# Patient Record
Sex: Male | Born: 1965 | Race: Black or African American | Hispanic: No | Marital: Single | Smoking: Never smoker
Health system: Southern US, Community
[De-identification: ages and names within clinical notes are randomized; demographics above are authoritative.]

---

## 2016-06-12 ENCOUNTER — Emergency Department (HOSPITAL_COMMUNITY): Payer: BLUE CROSS/BLUE SHIELD

## 2016-06-12 ENCOUNTER — Emergency Department (HOSPITAL_COMMUNITY)
Admission: EM | Admit: 2016-06-12 | Discharge: 2016-06-12 | Disposition: A | Discharge status: Home / Self Care | Payer: BLUE CROSS/BLUE SHIELD | Attending: Emergency Medicine | Admitting: Emergency Medicine

## 2016-06-12 ENCOUNTER — Encounter (HOSPITAL_COMMUNITY): Payer: Self-pay | Admitting: *Deleted

## 2016-06-12 DIAGNOSIS — R519 Headache, unspecified: Secondary | ICD-10-CM

## 2016-06-12 DIAGNOSIS — R51 Headache: Secondary | ICD-10-CM | POA: Insufficient documentation

## 2016-06-12 LAB — I-STAT CHEM 8, ED
BUN: 21 mg/dL — AB (ref 6–20)
CALCIUM ION: 1.12 mmol/L — AB (ref 1.15–1.40)
CHLORIDE: 99 mmol/L — AB (ref 101–111)
Creatinine, Ser: 1.4 mg/dL — ABNORMAL HIGH (ref 0.61–1.24)
GLUCOSE: 173 mg/dL — AB (ref 65–99)
HCT: 49 % (ref 39.0–52.0)
Hemoglobin: 16.7 g/dL (ref 13.0–17.0)
Potassium: 4.8 mmol/L (ref 3.5–5.1)
Sodium: 138 mmol/L (ref 135–145)
TCO2: 27 mmol/L (ref 0–100)

## 2016-06-12 MED ORDER — DIPHENHYDRAMINE HCL 50 MG/ML IJ SOLN
25.0000 mg | Freq: Once | INTRAMUSCULAR | Status: AC
  Administered 2016-06-12: 25 mg via INTRAVENOUS
  Filled 2016-06-12: qty 1

## 2016-06-12 MED ORDER — SODIUM CHLORIDE 0.9 % IV BOLUS (SEPSIS)
500.0000 mL | Freq: Once | INTRAVENOUS | Status: AC
  Administered 2016-06-12: 500 mL via INTRAVENOUS

## 2016-06-12 MED ORDER — METOCLOPRAMIDE HCL 5 MG/ML IJ SOLN
10.0000 mg | Freq: Once | INTRAMUSCULAR | Status: AC
Start: 2016-06-12 — End: 2016-06-12
  Administered 2016-06-12: 10 mg via INTRAVENOUS
  Filled 2016-06-12: qty 2

## 2016-06-12 MED ORDER — KETOROLAC TROMETHAMINE 30 MG/ML IJ SOLN
30.0000 mg | Freq: Once | INTRAMUSCULAR | Status: AC
  Administered 2016-06-12: 30 mg via INTRAVENOUS
  Filled 2016-06-12: qty 1

## 2016-06-12 NOTE — ED Notes (Signed)
Patient able to ambulate independently  

## 2016-06-12 NOTE — ED Triage Notes (Addendum)
Pt sent here from an ucc for headache since yesterday that he describes as "worst HA of my life" onset yesterday morning with n/v and sensitivity to light. Denies fever or neck pain. Denies hx of migraines. Minimal relief with ibuprofen.

## 2016-06-12 NOTE — ED Notes (Signed)
Pt transported to CT ?

## 2016-06-12 NOTE — ED Provider Notes (Signed)
Emergency Department Provider Note   I have reviewed the triage vital signs and the nursing notes.   HISTORY  Chief Complaint Headache   HPI Mason Garcia is a 50 y.o. male with no significant PMH presents to the emergent department for evaluation of acute onset headache. Patient's headache began suddenly while he was drinking coffee yesterday morning. He described a headache that reached maximal intensity over a course approximately 10 minutes was located primarily over his right head. No associated neck stiffness or fever. No history of similar headaches in the past. No head trauma. Patient states that he associated with drinking coffee and so vomited that up and felt slightly better. The headache never completely went away and he was able to sleep overnight. This morning, the patient again drink coffee and his headache suddenly worsened.   He denies any weakness or numbness. No gait instability. No continued nausea and or vomiting.   History reviewed. No pertinent past medical history.  There are no active problems to display for this patient.   History reviewed. No pertinent surgical history.    Allergies Tomasa BlaseBacon flavor  History reviewed. No pertinent family history.  Social History Social History  Substance Use Topics  . Smoking status: Never Smoker  . Smokeless tobacco: Not on file  . Alcohol use Yes    Review of Systems  Constitutional: No fever/chills Eyes: No visual changes. ENT: No sore throat. Cardiovascular: Denies chest pain. Respiratory: Denies shortness of breath. Gastrointestinal: No abdominal pain.  No nausea, no vomiting.  No diarrhea.  No constipation. Genitourinary: Negative for dysuria. Musculoskeletal: Negative for back pain. Skin: Negative for rash. Neurological: Negative for focal weakness or numbness. Positive HA.   10-point ROS otherwise negative.  ____________________________________________   PHYSICAL EXAM:  VITAL  SIGNS: ED Triage Vitals  Enc Vitals Group     BP 06/12/16 1748 (!) 163/102     Pulse Rate 06/12/16 1748 66     Resp 06/12/16 1748 18     Temp 06/12/16 1748 98.3 F (36.8 C)     Temp Source 06/12/16 1748 Oral     SpO2 06/12/16 1748 100 %     Pain Score 06/12/16 1754 7   Constitutional: Alert and oriented. Well appearing and in no acute distress. Eyes: Conjunctivae are normal.  Head: Atraumatic. Nose: No congestion/rhinnorhea. Mouth/Throat: Mucous membranes are moist.  Oropharynx non-erythematous. Neck: No stridor.  No meningeal signs. Cardiovascular: Normal rate, regular rhythm. Good peripheral circulation. Grossly normal heart sounds.   Respiratory: Normal respiratory effort.  No retractions. Lungs CTAB. Gastrointestinal: Soft and nontender. No distention.  Musculoskeletal: No lower extremity tenderness nor edema. No gross deformities of extremities. Neurologic:  Normal speech and language. No gross focal neurologic deficits are appreciated. Normal gait.  Skin:  Skin is warm, dry and intact. No rash noted.  ____________________________________________   LABS (all labs ordered are listed, but only abnormal results are displayed)  Labs Reviewed  I-STAT CHEM 8, ED - Abnormal; Notable for the following:       Result Value   Chloride 99 (*)    BUN 21 (*)    Creatinine, Ser 1.40 (*)    Glucose, Bld 173 (*)    Calcium, Ion 1.12 (*)    All other components within normal limits   ____________________________________________  RADIOLOGY  Ct Head Wo Contrast  Result Date: 06/12/2016 CLINICAL DATA:  Acute onset of generalized headache, nausea and vomiting. Initial encounter. EXAM: CT HEAD WITHOUT CONTRAST TECHNIQUE: Contiguous axial images were  obtained from the base of the skull through the vertex without intravenous contrast. COMPARISON:  None. FINDINGS: Brain: No evidence of acute infarction, hemorrhage, hydrocephalus, extra-axial collection or mass lesion/mass effect. The  posterior fossa, including the cerebellum, brainstem and fourth ventricle, is within normal limits. The third and lateral ventricles, and basal ganglia are unremarkable in appearance. The cerebral hemispheres are symmetric in appearance, with normal gray-white differentiation. No mass effect or midline shift is seen. Vascular: No hyperdense vessel or unexpected calcification. Skull: There is no evidence of fracture; visualized osseous structures are unremarkable in appearance. Sinuses/Orbits: The orbits are within normal limits. The paranasal sinuses and mastoid air cells are well-aerated. Other: No significant soft tissue abnormalities are seen. IMPRESSION: Unremarkable noncontrast CT of the head. Electronically Signed   By: Roanna RaiderJeffery  Chang M.D.   On: 06/12/2016 20:39    ____________________________________________   PROCEDURES  Procedure(s) performed:   Procedures  None ____________________________________________   INITIAL IMPRESSION / ASSESSMENT AND PLAN / ED COURSE  Pertinent labs & imaging results that were available during my care of the patient were reviewed by me and considered in my medical decision making (see chart for details).  Patient presents to the emergency department for evaluation of sudden onset headache both this morning and yesterday morning seen to be associated with coffee. This is somewhat atypical story but the patient is describing sudden onset maximal intensity headache. Plan for CT scan and baseline labs. If negative will discuss with patient regarding the risks and benefits of lumbar puncture.   09:13 PM Patient is feeling much better after Reglan and Benadryl. CT scan of the head is negative for bleed. I discussed that given the nature of his headache aneurysmal sentinel bleed is still my differential. I discussed that missing this sort of diagnosis could lead to catastrophic morbidity or even mortality. I discussed that lumbar puncture is the next step to rule  out this diagnosis and explained the procedure to the patient. After discussion he decided he would NOT like to pursue the lumbar puncture. He has capacity to understand this and make this decision. Discussed PCP/Neurology follow up and return precautions if HA returns. Will add Toradol with HA almost completely resolved.  ____________________________________________  FINAL CLINICAL IMPRESSION(S) / ED DIAGNOSES  Final diagnoses:  Acute nonintractable headache, unspecified headache type     MEDICATIONS GIVEN DURING THIS VISIT:  Medications  sodium chloride 0.9 % bolus 500 mL (0 mLs Intravenous Stopped 06/12/16 2206)  metoCLOPramide (REGLAN) injection 10 mg (10 mg Intravenous Given 06/12/16 2127)  diphenhydrAMINE (BENADRYL) injection 25 mg (25 mg Intravenous Given 06/12/16 2125)  ketorolac (TORADOL) 30 MG/ML injection 30 mg (30 mg Intravenous Given 06/12/16 2125)     NEW OUTPATIENT MEDICATIONS STARTED DURING THIS VISIT:  None   Note:  This document was prepared using Dragon voice recognition software and may include unintentional dictation errors.  Alona BeneJoshua Long, MD Emergency Medicine   Maia PlanJoshua G Long, MD 06/12/16 [phone]

## 2016-06-12 NOTE — Discharge Instructions (Signed)

## 2017-10-24 IMAGING — CT CT HEAD W/O CM
4 series · 16 of 47 positions shown, 18 images · non-contrast
Comparison: None.

CLINICAL DATA: Acute onset of generalized headache, nausea and
vomiting. Initial encounter.

EXAM:
CT HEAD WITHOUT CONTRAST
TECHNIQUE: Contiguous axial images were obtained from the base of the skull
through the vertex without intravenous contrast.

[Series 2: head without · axial · non-contrast · 0.45mm/px · z∈[-4,+126]mm · 7 of 36 slices shown, 9 images]
[im 5/36  brain]
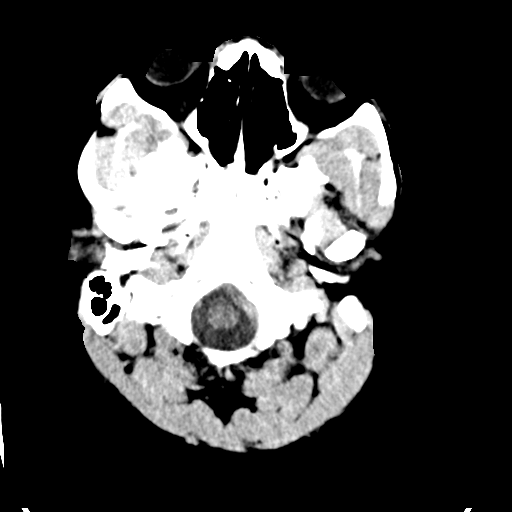
[im 5/36  bone]
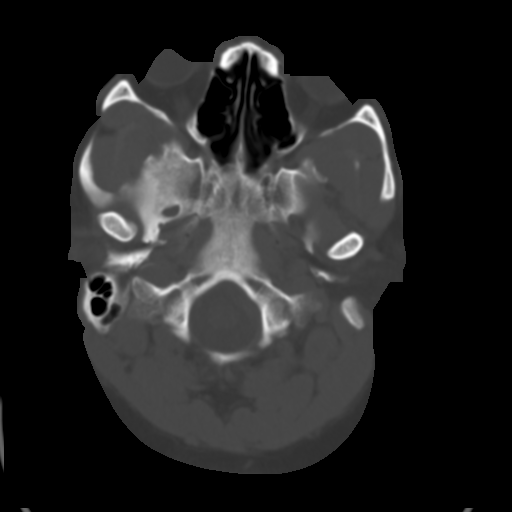
[im 9/36  brain]
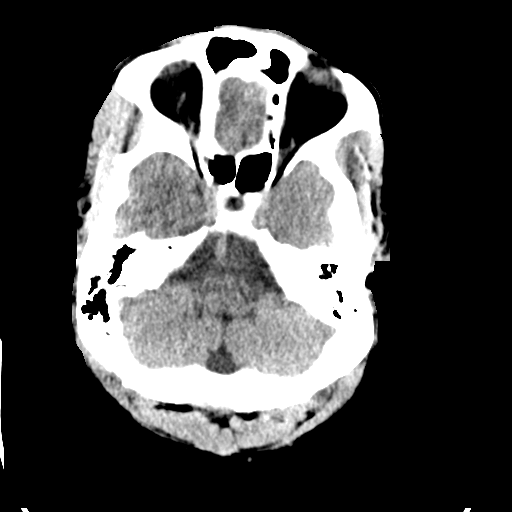
[im 14/36  brain]
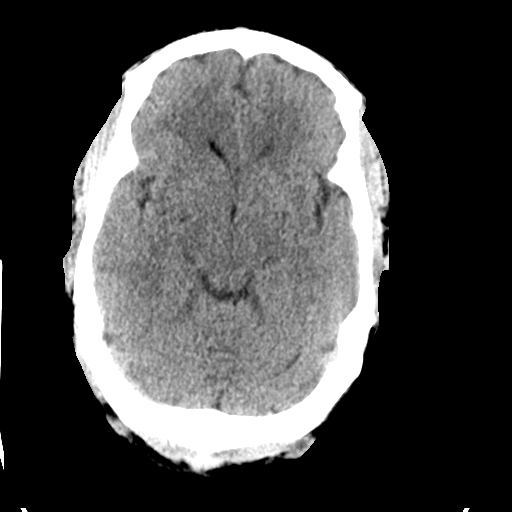
[im 18/36  brain]
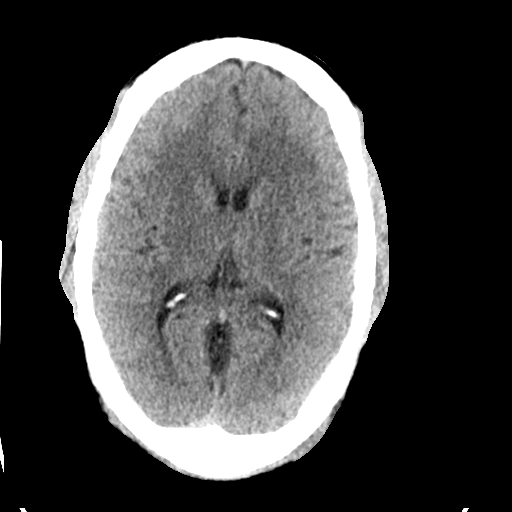
[im 22/36  brain]
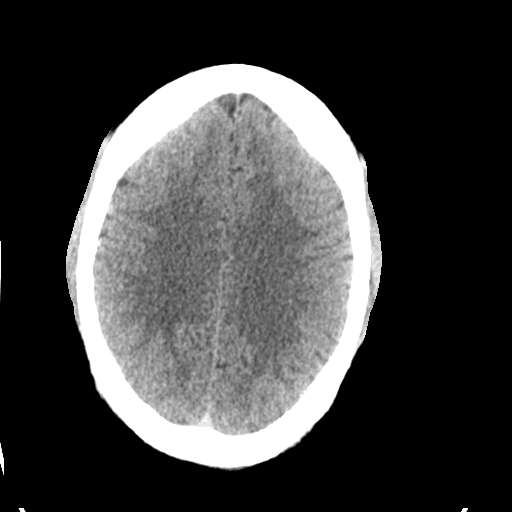
[im 22/36  bone]
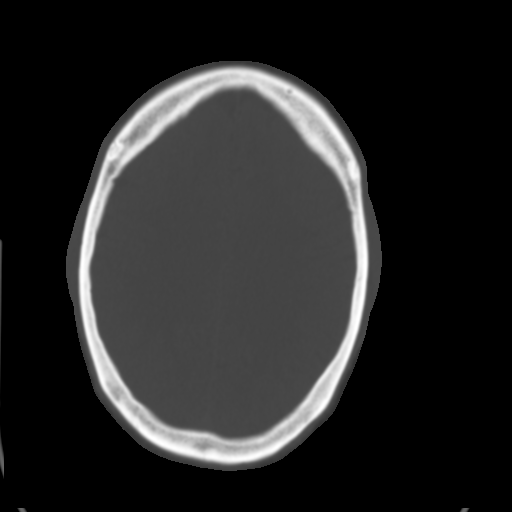
[im 27/36  brain]
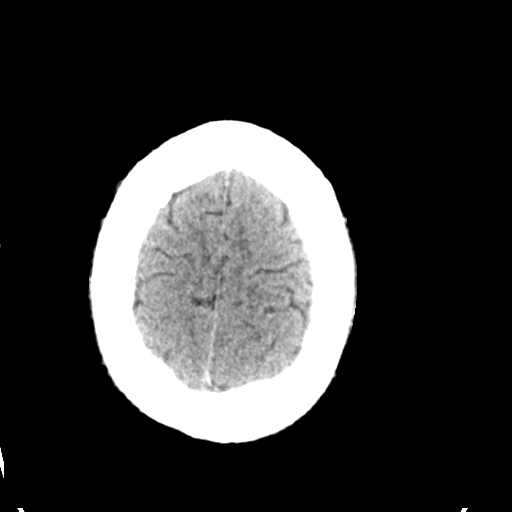
[im 31/36  brain]
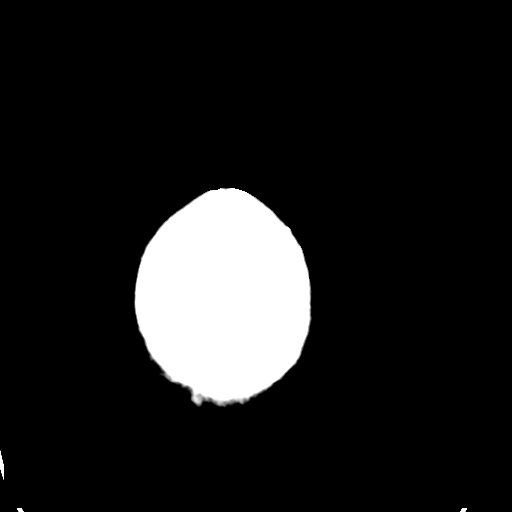

[Series 3: head bone · axial · 0.45mm/px · z∈[-8,+28]mm · 3 of 89 slices shown]
[im 9/89  bone]
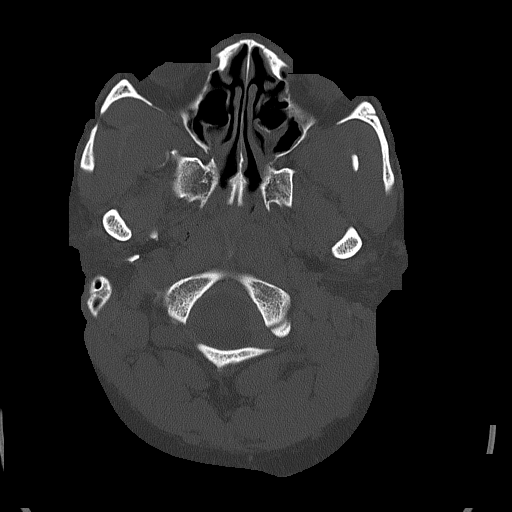
[im 18/89  bone]
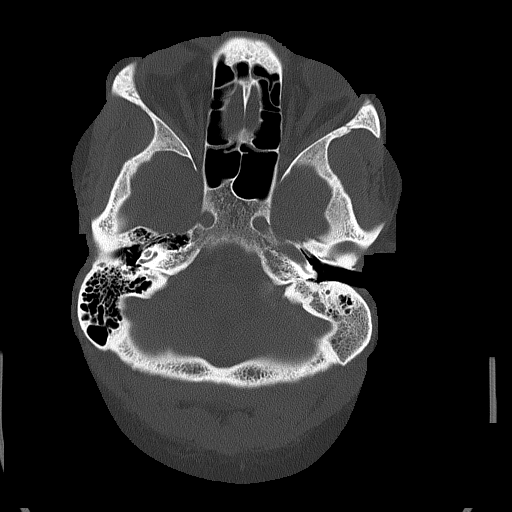
[im 27/89  bone]
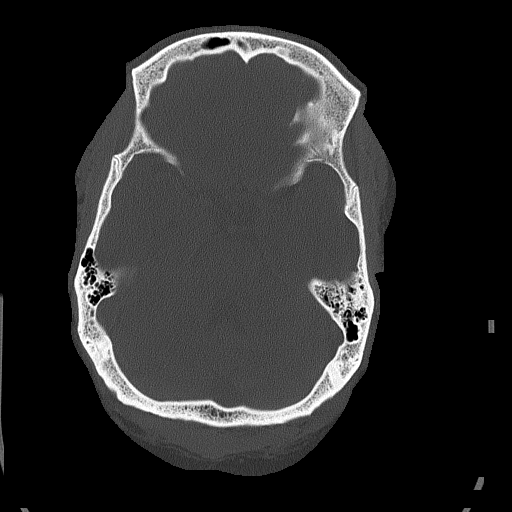

[Series 4: head without cor · coronal · non-contrast · 0.34mm/px · 3 of 78 slices shown]
[im 26/78  brain]
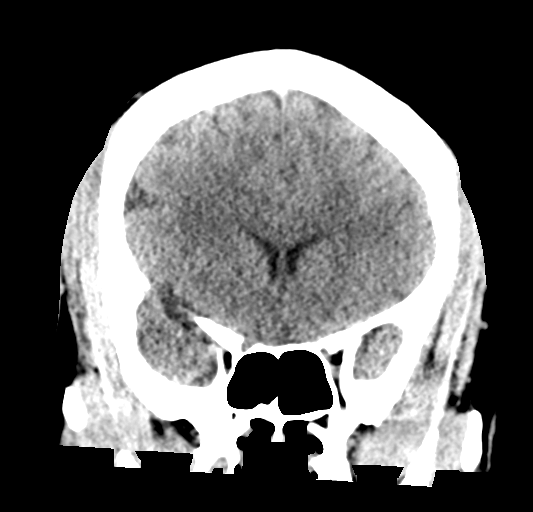
[im 35/78  brain]
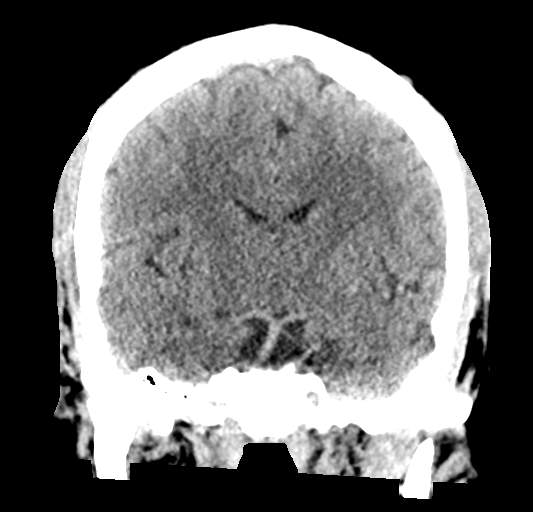
[im 43/78  brain]
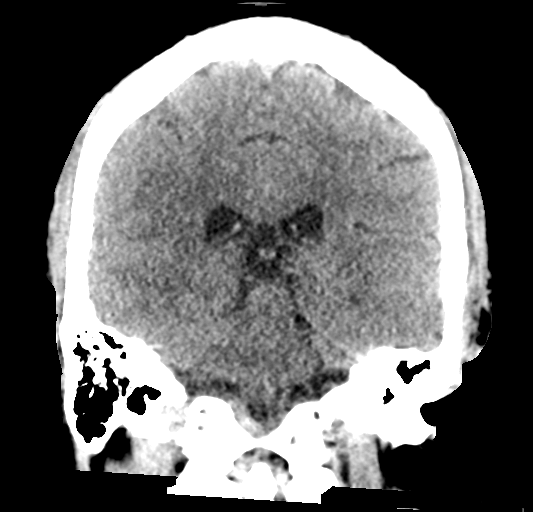

[Series 5: head without sag · sagittal · non-contrast · 0.35mm/px · 3 of 67 slices shown]
[im 23/67  brain]
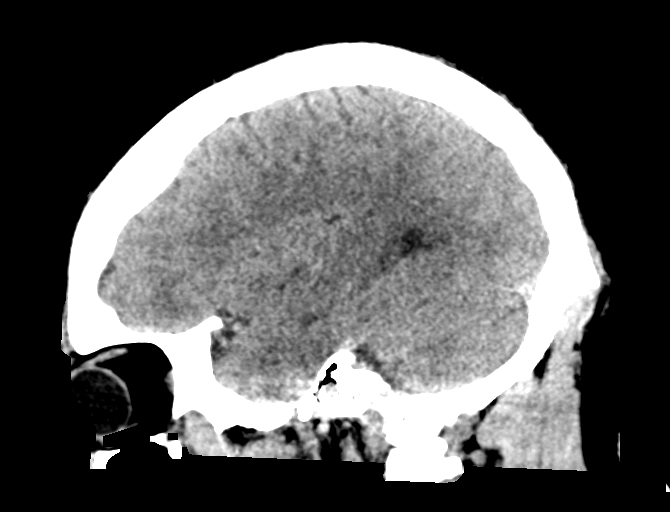
[im 34/67  brain]
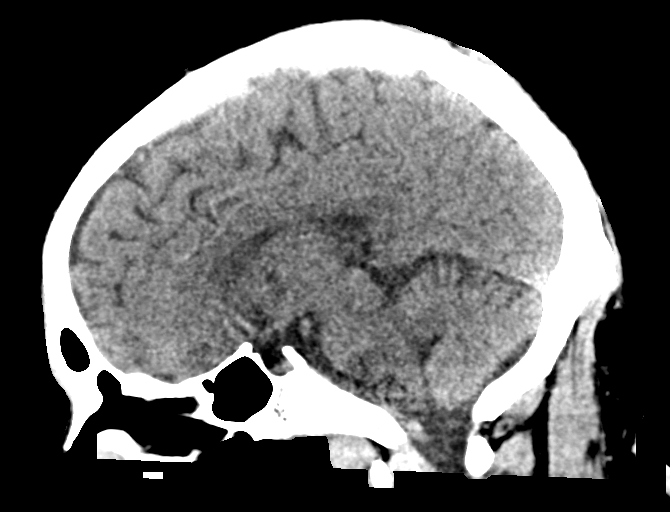
[im 45/67  brain]
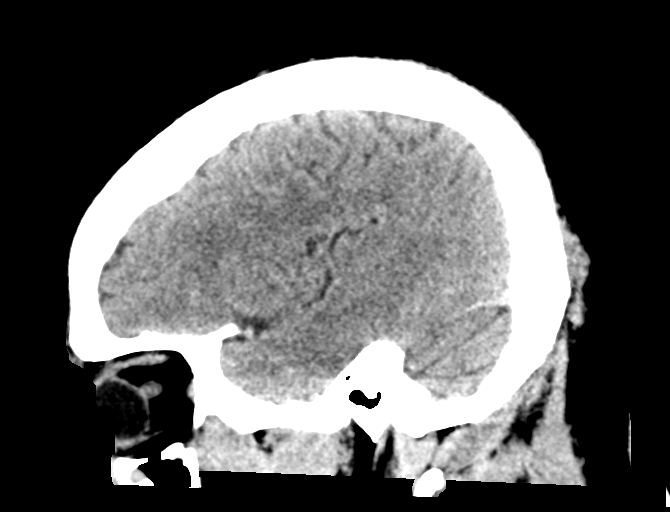

[16 of 47 positions shown; findings below may reference images not displayed]

FINDINGS: Brain: No evidence of acute infarction, hemorrhage, hydrocephalus,
extra-axial collection or mass lesion/mass effect.

The posterior fossa, including the cerebellum, brainstem and fourth
ventricle, is within normal limits. The third and lateral
ventricles, and basal ganglia are unremarkable in appearance. The
cerebral hemispheres are symmetric in appearance, with normal
gray-white differentiation. No mass effect or midline shift is seen.

Vascular: No hyperdense vessel or unexpected calcification.

Skull: There is no evidence of fracture; visualized osseous
structures are unremarkable in appearance.

Sinuses/Orbits: The orbits are within normal limits. The paranasal
sinuses and mastoid air cells are well-aerated.

Other: No significant soft tissue abnormalities are seen.
IMPRESSION: Unremarkable noncontrast CT of the head.
